# Patient Record
Sex: Female | Born: 1935 | Hispanic: No | Smoking: Never smoker
Health system: Southern US, Community
[De-identification: ages and names within clinical notes are randomized; demographics above are authoritative.]

## PROBLEM LIST (undated history)

## (undated) DIAGNOSIS — F32A Depression, unspecified: Secondary | ICD-10-CM

## (undated) DIAGNOSIS — E559 Vitamin D deficiency, unspecified: Secondary | ICD-10-CM

## (undated) DIAGNOSIS — F329 Major depressive disorder, single episode, unspecified: Secondary | ICD-10-CM

## (undated) DIAGNOSIS — I4891 Unspecified atrial fibrillation: Secondary | ICD-10-CM

## (undated) DIAGNOSIS — I499 Cardiac arrhythmia, unspecified: Secondary | ICD-10-CM

## (undated) DIAGNOSIS — N189 Chronic kidney disease, unspecified: Secondary | ICD-10-CM

## (undated) DIAGNOSIS — G243 Spasmodic torticollis: Secondary | ICD-10-CM

## (undated) DIAGNOSIS — K449 Diaphragmatic hernia without obstruction or gangrene: Secondary | ICD-10-CM

## (undated) DIAGNOSIS — E119 Type 2 diabetes mellitus without complications: Secondary | ICD-10-CM

## (undated) DIAGNOSIS — E7800 Pure hypercholesterolemia, unspecified: Secondary | ICD-10-CM

## (undated) DIAGNOSIS — I739 Peripheral vascular disease, unspecified: Secondary | ICD-10-CM

## (undated) DIAGNOSIS — K5792 Diverticulitis of intestine, part unspecified, without perforation or abscess without bleeding: Secondary | ICD-10-CM

## (undated) DIAGNOSIS — I10 Essential (primary) hypertension: Secondary | ICD-10-CM

## (undated) HISTORY — DX: Diaphragmatic hernia without obstruction or gangrene: K44.9

## (undated) HISTORY — DX: Spasmodic torticollis: G24.3

## (undated) HISTORY — DX: Vitamin D deficiency, unspecified: E55.9

## (undated) HISTORY — DX: Diverticulitis of intestine, part unspecified, without perforation or abscess without bleeding: K57.92

## (undated) HISTORY — DX: Depression, unspecified: F32.A

## (undated) HISTORY — PX: ABDOMINAL HYSTERECTOMY: SHX81

## (undated) HISTORY — DX: Essential (primary) hypertension: I10

## (undated) HISTORY — DX: Type 2 diabetes mellitus without complications: E11.9

## (undated) HISTORY — DX: Unspecified atrial fibrillation: I48.91

## (undated) HISTORY — DX: Cardiac arrhythmia, unspecified: I49.9

## (undated) HISTORY — DX: Peripheral vascular disease, unspecified: I73.9

## (undated) HISTORY — PX: CHOLECYSTECTOMY: SHX55

## (undated) HISTORY — PX: OTHER SURGICAL HISTORY: SHX169

## (undated) HISTORY — DX: Pure hypercholesterolemia, unspecified: E78.00

## (undated) HISTORY — DX: Chronic kidney disease, unspecified: N18.9

---
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
## 2018-11-15 ENCOUNTER — Other Ambulatory Visit: Payer: Self-pay | Admitting: Internal Medicine

## 2018-11-15 DIAGNOSIS — I4891 Unspecified atrial fibrillation: Secondary | ICD-10-CM

## 2018-11-23 ENCOUNTER — Other Ambulatory Visit: Payer: Self-pay

## 2018-11-23 ENCOUNTER — Encounter
Admission: RE | Admit: 2018-11-23 | Discharge: 2018-11-23 | Disposition: A | Discharge status: Home / Self Care | Payer: Medicare Other | Source: Ambulatory Visit | Attending: Internal Medicine | Admitting: Internal Medicine

## 2018-11-23 DIAGNOSIS — I4891 Unspecified atrial fibrillation: Secondary | ICD-10-CM | POA: Insufficient documentation

## 2018-11-23 LAB — NM MYOCAR MULTI W/SPECT W/WALL MOTION / EF
Estimated workload: 1 METS
Exercise duration (min): 1 min
Exercise duration (sec): 0 s
LV dias vol: 72 mL (ref 46–106)
LV sys vol: 24 mL
MPHR: 133 {beats}/min
Peak HR: 85 {beats}/min
Percent HR: 63 %
Rest HR: 55 {beats}/min
SDS: 0
SRS: 2
SSS: 0
TID: 1.02

## 2018-11-23 MED ORDER — REGADENOSON 0.4 MG/5ML IV SOLN
0.4000 mg | Freq: Once | INTRAVENOUS | Status: AC
  Administered 2018-11-23: 0.4 mg via INTRAVENOUS

## 2018-11-23 MED ORDER — TECHNETIUM TC 99M TETROFOSMIN IV KIT
30.0000 | PACK | Freq: Once | INTRAVENOUS | Status: AC | PRN
  Administered 2018-11-23: 32.073 via INTRAVENOUS

## 2018-11-23 MED ORDER — TECHNETIUM TC 99M TETROFOSMIN IV KIT
10.0000 | PACK | Freq: Once | INTRAVENOUS | Status: AC | PRN
  Administered 2018-11-23: 10.37 via INTRAVENOUS

## 2018-12-11 ENCOUNTER — Other Ambulatory Visit: Payer: Self-pay | Admitting: Family Medicine

## 2018-12-11 DIAGNOSIS — Z1231 Encounter for screening mammogram for malignant neoplasm of breast: Secondary | ICD-10-CM

## 2018-12-31 ENCOUNTER — Other Ambulatory Visit: Payer: Self-pay

## 2018-12-31 ENCOUNTER — Ambulatory Visit
Admission: RE | Admit: 2018-12-31 | Discharge: 2018-12-31 | Disposition: A | Discharge status: Home / Self Care | Payer: Medicare Other | Source: Ambulatory Visit | Attending: Family Medicine | Admitting: Family Medicine

## 2018-12-31 DIAGNOSIS — Z1231 Encounter for screening mammogram for malignant neoplasm of breast: Secondary | ICD-10-CM | POA: Diagnosis not present

## 2019-04-15 ENCOUNTER — Encounter: Payer: Self-pay | Admitting: *Deleted

## 2019-04-16 ENCOUNTER — Ambulatory Visit: Payer: Medicare Other | Admitting: Neurology

## 2019-04-16 ENCOUNTER — Other Ambulatory Visit: Payer: Self-pay

## 2019-04-16 ENCOUNTER — Encounter: Payer: Self-pay | Admitting: Neurology

## 2019-04-16 ENCOUNTER — Ambulatory Visit (INDEPENDENT_AMBULATORY_CARE_PROVIDER_SITE_OTHER): Payer: Medicare Other | Admitting: Neurology

## 2019-04-16 VITALS — BP 133/72 | HR 68 | Temp 97.2°F | Ht <= 58 in | Wt 177.5 lb

## 2019-04-16 DIAGNOSIS — G243 Spasmodic torticollis: Secondary | ICD-10-CM | POA: Diagnosis not present

## 2019-04-16 NOTE — Progress Notes (Signed)
PATIENT: Cindy Barnes DOB: June 17, 1929  Chief Complaint  Patient presents with  . Cervical Dystonia    She was previously getting Botox injections at [facility]. Her last injection was 02/04/19. She would like to establish care closer to home.  Marland Kitchen PCP    Kandyce Rud, MD     HISTORICAL  Cindy Barnes is a 86 year old female, seen in request by her primary care physician Dr. Larwance Sachs, Berna Spare for evaluation of continued botulism toxin injection for her cervical dystonia, initial evaluation was on April 16, 2019.  I have reviewed and summarized the referring note from the referring physician.  She had past medical history of hypertension, hyperlipidemia, diabetes, atrial fibrillation, on chronic anticoagulation treatment.  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,  [place] [facility] [place] [facility], she began to notice cervical dystonia symptoms in her 74s, at that time, she was under a lot of stress, develop had drawing to right shoulder, significant neck pain, eventually to the point is difficult for her to straighten out her neck, she had partial right accessory nerve resection by neurosurgeon at the 1980s, which has made a big difference," likes which was turned off", since surgery, she denies significant low back pain  Over the years, her symptoms gradually returned, but to a much lesser degree, she described tendency of holding her head towards her right shoulder, frequent head titubation, also developed gradual onset bilateral hands tremor, she used to be right-handed, to the point she has difficulty writing with her right hand, she learned to write with her left hand, now even her left hand handwriting is ineligible, she also described gradual onset of voice change,  She began to receive EMG guided botulism toxin injection at [place] since 1990s, which has helped her symptoms, since movement to [place], she received a few injection by Dr.  Clovis Riley, last one was on February 04, 2019, she received 300 units, tolerating it well, denies significant side effect.  She denies gait abnormality, no significant pain,  REVIEW OF SYSTEMS: Full 14 system review of systems performed and notable only for as above All other review of systems were negative.  ALLERGIES: Allergies  Allergen Reactions  . Shellfish Allergy Nausea And Vomiting    Severe nausea/vomiting and diarrhea Severe nausea/vomiting and diarrhea   . Doxycycline Diarrhea  . Metronidazole Other (See Comments)  . Olmesartan Medoxomil-Hctz     Other reaction(s): Unknown  . Penicillin G Rash  . Sulfa Antibiotics Rash  . Sulfamethoxazole-Trimethoprim Rash    HOME MEDICATIONS: Current Outpatient Medications  Medication Sig Dispense Refill  . amiodarone (PACERONE) 200 MG tablet Take 200 mg by mouth as needed. M, W, F    . amLODipine (NORVASC) 10 MG tablet Take 1 tablet by mouth daily.    Marland Kitchen apixaban (ELIQUIS) 5 MG TABS tablet Take 1 tablet by mouth daily.    Marland Kitchen aspirin 81 MG chewable tablet Chew 1 tablet by mouth daily.    . B Complex Vitamins (B COMPLEX 1 PO) B Complex  1 QD    . BIOTIN PO Take 10,000 mcg by mouth daily.    . Cholecalciferol (VITAMIN D3 PO) Take 1,000 Units by mouth daily.    Marland Kitchen ezetimibe (ZETIA) 10 MG tablet Take 1 tablet by mouth daily.    Marland Kitchen losartan (COZAAR) 25 MG tablet Take 1 tablet by mouth daily.    . Magnesium Oxide 400 MG CAPS Take 1 capsule by mouth daily.    Marland Kitchen  metFORMIN (GLUCOPHAGE) 500 MG tablet Take 500 mg by mouth daily.    . Omega-3 Fatty Acids (FISH OIL) 1000 MG CAPS Take 1 capsule by mouth daily.    . propranolol (INNOPRAN XL) 80 MG 24 hr capsule Take 1 capsule by mouth daily.    . rosuvastatin (CRESTOR) 5 MG tablet Take 1 tablet by mouth daily.     No current facility-administered medications for this visit.    PAST MEDICAL HISTORY: Past Medical History:  Diagnosis Date  . Arrhythmia   . Atrial fibrillation (HCC)   . Cervical  dystonia   . Chronic kidney disease    stage 3  . Depression   . Diabetes (HCC)   . Diverticulitis   . Hiatal hernia   . Hypercholesteremia   . Hypertension   . Peripheral vascular disease (HCC)   . Vitamin D deficiency     PAST SURGICAL HISTORY: Past Surgical History:  Procedure Laterality Date  . 11th cranial nerve transection    . ABDOMINAL HYSTERECTOMY    . CHOLECYSTECTOMY      FAMILY HISTORY: Family History  Problem Relation Age of Onset  . Heart attack Mother   . COPD Father     SOCIAL HISTORY: Social History   Socioeconomic History  . Marital status: Widowed    Spouse name: Not on file  . Number of children: 2  . Years of education: college  . Highest education level: Bachelor's degree (e.g., BA, AB, BS)  Occupational History  . Occupation: Retired  Tobacco Use  . Smoking status: Never Smoker  . Smokeless tobacco: Never Used  Substance and Sexual Activity  . Alcohol use: Yes    Comment: one glass of wine per week  . Drug use: Never  . Sexual activity: Not on file  Other Topics Concern  . Not on file  Social History Narrative   Lives alone.   One Coke per day.   Ambidextrous.   Social Determinants of Health   Financial Resource Strain:   . Difficulty of Paying Living Expenses:   Food Insecurity:   . Worried About Programme researcher, broadcasting/film/video in the Last Year:   . Barista in the Last Year:   Transportation Needs:   . Freight forwarder (Medical):   Marland Kitchen Lack of Transportation (Non-Medical):   Physical Activity:   . Days of Exercise per Week:   . Minutes of Exercise per Session:   Stress:   . Feeling of Stress :   Social Connections:   . Frequency of Communication with Friends and Family:   . Frequency of Social Gatherings with Friends and Family:   . Attends Religious Services:   . Active Member of Clubs or Organizations:   . Attends Banker Meetings:   Marland Kitchen Marital Status:   Intimate Partner Violence:   . Fear of Current or  Ex-Partner:   . Emotionally Abused:   Marland Kitchen Physically Abused:   . Sexually Abused:      PHYSICAL EXAM   Vitals:   04/16/19 0748  BP: 133/72  Pulse: 68  Temp: (!) 97.2 F (36.2 C)  Weight: 177 lb 8 oz (80.5 kg)  Height: 4\' 7"  (1.397 m)    Not recorded      Body mass index is 41.25 kg/m.  PHYSICAL EXAMNIATION:  Gen: NAD, conversant, well nourised, well groomed                     Cardiovascular: Regular  rate rhythm, no peripheral edema, warm, nontender. Eyes: Conjunctivae clear without exudates or hemorrhage Neck: Supple, no carotid bruits. Pulmonary: Clear to auscultation bilaterally   NEUROLOGICAL EXAM:  MENTAL STATUS: She has moderate right turn, mild anterocollis, left tilt, left shoulder elevation, constant head titubation Speech:    Speech is normal; fluent and spontaneous with normal comprehension.  Cognition:     Orientation to time, place and person     Normal recent and remote memory     Normal Attention span and concentration     Normal Language, naming, repeating,spontaneous speech     Fund of knowledge   CRANIAL NERVES: CN II: Visual fields are full to confrontation. Pupils are round equal and briskly reactive to light. CN III, IV, VI: extraocular movement are normal. No ptosis. CN V: Facial sensation is intact to light touch CN VII: Face is symmetric with normal eye closure  CN VIII: Hearing is normal to causal conversation. CN IX, X: Phonation is normal. CN XI: Head turning and shoulder shrug are intact  MOTOR: There is no pronator drift of out-stretched arms. Muscle bulk and tone are normal. Muscle strength is normal.  REFLEXES: Reflexes are 2+ and symmetric at the biceps, triceps, knees, and ankles. Plantar responses are flexor.  SENSORY: Intact to light touch, pinprick and vibratory sensation are intact in fingers and toes.  COORDINATION: There is no trunk or limb dysmetria noted.  GAIT/STANCE: Posture is normal. Gait is steady with  normal steps, base, arm swing, and turning.  Romberg is absent.   DIAGNOSTIC DATA (LABS, IMAGING, TESTING) - I reviewed patient records, labs, notes, testing and imaging myself where available.   ASSESSMENT AND PLAN  Cindy Barnes is a 86 y.o. 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 Ph.D.  [facility] [address] [place],  [address] Ph: [phone] Fax: [phone]  CC: Derinda Late, MD

## 2019-04-29 ENCOUNTER — Telehealth: Payer: Self-pay | Admitting: *Deleted

## 2019-04-29 NOTE — Telephone Encounter (Signed)
I called [employer] [phone] and spoke to Molly Maduro who verified that patient is eligible for coverage.  States he cant check codes but referred me to call [phone]. I called and spoke to [facility].  She states that since Medicare is primary [employer] will not require PA.  No ref# for this call was provided.

## 2019-05-08 ENCOUNTER — Encounter: Payer: Self-pay | Admitting: Neurology

## 2019-05-08 ENCOUNTER — Ambulatory Visit (INDEPENDENT_AMBULATORY_CARE_PROVIDER_SITE_OTHER): Payer: Medicare Other | Admitting: Neurology

## 2019-05-08 ENCOUNTER — Other Ambulatory Visit: Payer: Self-pay

## 2019-05-08 VITALS — BP 127/72 | HR 58 | Temp 97.1°F | Ht <= 58 in | Wt 179.5 lb

## 2019-05-08 DIAGNOSIS — G243 Spasmodic torticollis: Secondary | ICD-10-CM

## 2019-05-08 MED ORDER — ONABOTULINUMTOXINA 100 UNITS IJ SOLR
200.0000 [IU] | Freq: Once | INTRAMUSCULAR | Status: AC
  Administered 2019-05-08: 16:00:00 200 [IU] via INTRAMUSCULAR

## 2019-05-08 NOTE — Progress Notes (Signed)
**  Botox 200 units x 1 vial, NDC 9021-1155-20, Lot [id number], Exp 12/2021, office supply.//mck,rn**

## 2019-05-08 NOTE — Progress Notes (Signed)
PATIENT: Cindy Barnes DOB: November 29, 1929  Chief Complaint  Patient presents with  . Cervical Dystonia    Botox 200 units x 1 vial - office supply     HISTORICAL  Cindy Barnes is a 86 year old female, seen in request by her primary care physician Dr. Baldemar Lenis, Beverely Low for evaluation of continued botulism toxin injection for her cervical dystonia, initial evaluation was on April 16, 2019.  I have reviewed and summarized the referring note from the referring physician.  She had past medical history of hypertension, hyperlipidemia, diabetes, atrial fibrillation, on chronic anticoagulation treatment.  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,  She [facility] from [place] [facility] in [facility], she began to notice cervical dystonia symptoms in her 52s, at that time, she was under a lot of stress, develop had drawing to right shoulder, significant neck pain, eventually to the point is difficult for her to straighten out her neck, she had partial right accessory nerve resection by neurosurgeon in 1980s, which has made a big difference," likes which was turned off", since surgery, she denies significant low back pain  Over the years, her symptoms gradually returned, but to a much lesser degree, she described tendency of holding her head towards her right shoulder, frequent head titubation, also developed gradual onset bilateral hands tremor, she used to be right-handed, to the point she has difficulty writing with her right hand, she learned to write with her left hand, now even her left hand handwriting is ineligible, she also described gradual onset of voice change,  She began to receive EMG guided botulism toxin injection at [place] since 1990s, which has helped her symptoms, since movement to [place], she received a few injection by Dr. Alroy Dust, last one was on February 04, 2019, she received 300 units, tolerating it well, denies significant side effect.  She denies gait  abnormality, no significant pain,  UPDATE May 08 2019: This is her first EMG guided Botox a injection for her cervical dystonia through our office, she denies significant neck pain," when getting so used to it", she does have constant head titubation, we decided to decrease Botox a from 300 units to 200 units today,  REVIEW OF SYSTEMS: Full 14 system review of systems performed and notable only for as above All other review of systems were negative.  ALLERGIES: Allergies  Allergen Reactions  . Shellfish Allergy Nausea And Vomiting    Severe nausea/vomiting and diarrhea Severe nausea/vomiting and diarrhea   . Doxycycline Diarrhea  . Metronidazole Other (See Comments)  . Olmesartan Medoxomil-Hctz     Other reaction(s): Unknown  . Penicillin G Rash  . Sulfa Antibiotics Rash  . Sulfamethoxazole-Trimethoprim Rash    HOME MEDICATIONS: Current Outpatient Medications  Medication Sig Dispense Refill  . amiodarone (PACERONE) 200 MG tablet Take 200 mg by mouth as needed. M, W, F    . amLODipine (NORVASC) 10 MG tablet Take 1 tablet by mouth daily.    Marland Kitchen apixaban (ELIQUIS) 5 MG TABS tablet Take 1 tablet by mouth daily.    Marland Kitchen aspirin 81 MG chewable tablet Chew 1 tablet by mouth daily.    . B Complex Vitamins (B COMPLEX 1 PO) B Complex  1 QD    . BIOTIN PO Take 10,000 mcg by mouth daily.    . Botulinum Toxin Type A (BOTOX) 200 units SOLR Inject 200 Units as directed every 3 (three) months.    . Cholecalciferol (VITAMIN D3 PO) Take 1,000 Units by mouth daily.    Marland Kitchen  ezetimibe (ZETIA) 10 MG tablet Take 1 tablet by mouth daily.    Marland Kitchen losartan (COZAAR) 25 MG tablet Take 1 tablet by mouth daily.    . Magnesium Oxide 400 MG CAPS Take 1 capsule by mouth daily.    . metFORMIN (GLUCOPHAGE) 500 MG tablet Take 500 mg by mouth daily.    . Omega-3 Fatty Acids (FISH OIL) 1000 MG CAPS Take 1 capsule by mouth daily.    . propranolol (INNOPRAN XL) 80 MG 24 hr capsule Take 1 capsule by mouth daily.    .  rosuvastatin (CRESTOR) 5 MG tablet Take 1 tablet by mouth daily.     No current facility-administered medications for this visit.    PAST MEDICAL HISTORY: Past Medical History:  Diagnosis Date  . Arrhythmia   . Atrial fibrillation (HCC)   . Cervical dystonia   . Chronic kidney disease    stage 3  . Depression   . Diabetes (HCC)   . Diverticulitis   . Hiatal hernia   . Hypercholesteremia   . Hypertension   . Peripheral vascular disease (HCC)   . Vitamin D deficiency     PAST SURGICAL HISTORY: Past Surgical History:  Procedure Laterality Date  . 11th cranial nerve transection    . ABDOMINAL HYSTERECTOMY    . CHOLECYSTECTOMY      FAMILY HISTORY: Family History  Problem Relation Age of Onset  . Heart attack Mother   . COPD Father     SOCIAL HISTORY: Social History   Socioeconomic History  . Marital status: Widowed    Spouse name: Not on file  . Number of children: 2  . Years of education: college  . Highest education level: Bachelor's degree (e.g., BA, AB, BS)  Occupational History  . Occupation: Retired  Tobacco Use  . Smoking status: Never Smoker  . Smokeless tobacco: Never Used  Substance and Sexual Activity  . Alcohol use: Yes    Comment: one glass of wine per week  . Drug use: Never  . Sexual activity: Not on file  Other Topics Concern  . Not on file  Social History Narrative   Lives alone.   One Coke per day.   Ambidextrous.   Social Determinants of Health   Financial Resource Strain:   . Difficulty of Paying Living Expenses:   Food Insecurity:   . Worried About Programme researcher, broadcasting/film/video in the Last Year:   . Barista in the Last Year:   Transportation Needs:   . Freight forwarder (Medical):   Marland Kitchen Lack of Transportation (Non-Medical):   Physical Activity:   . Days of Exercise per Week:   . Minutes of Exercise per Session:   Stress:   . Feeling of Stress :   Social Connections:   . Frequency of Communication with Friends and Family:    . Frequency of Social Gatherings with Friends and Family:   . Attends Religious Services:   . Active Member of Clubs or Organizations:   . Attends Banker Meetings:   Marland Kitchen Marital Status:   Intimate Partner Violence:   . Fear of Current or Ex-Partner:   . Emotionally Abused:   Marland Kitchen Physically Abused:   . Sexually Abused:      PHYSICAL EXAM   Vitals:   05/08/19 1312  BP: 127/72  Pulse: (!) 58  Temp: (!) 97.1 F (36.2 C)  Weight: 179 lb 8 oz (81.4 kg)  Height: 4\' 7"  (1.397 m)  Not recorded      Body mass index is 41.72 kg/m.  PHYSICAL EXAMNIATION:  She has moderate right turn, mild anterocollis, left tilt, left shoulder elevation, constant head titubation   DIAGNOSTIC DATA (LABS, IMAGING, TESTING) - I reviewed patient records, labs, notes, testing and imaging myself where available.   ASSESSMENT AND PLAN  Cindy Barnes is a 86 y.o. 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  Last EMG guided botulism toxin injection was on February 04, 2019, used to Botox a 300 units,  Under EMG guidance, we used 200 units of Botox A today  Left sternocleidomastoid 25 units x 2= 50 units Left levator scapular 25 units   Left splenius capitis 25 units Left splenius cervix 25 units  Right longissimus capitis 25 units Right inferior oblique capitis 25 units Right splenius capitis 25 units   She complains of significant needle pain with each needle penetration, despite we changed to a different needle, she will return to clinic in 3 months for repeat injection   Levert Feinstein, M.D. Ph.D.  [facility] [address] [place], [place] [address] Ph: [phone] Fax: [phone]  CC: Kandyce Rud, MD

## 2019-05-14 ENCOUNTER — Ambulatory Visit: Payer: Medicare Other | Admitting: Neurology

## 2019-08-07 ENCOUNTER — Ambulatory Visit: Payer: Medicare Other | Admitting: Neurology

## 2019-11-25 ENCOUNTER — Other Ambulatory Visit: Payer: Self-pay | Admitting: Family Medicine

## 2019-11-25 DIAGNOSIS — Z1231 Encounter for screening mammogram for malignant neoplasm of breast: Secondary | ICD-10-CM

## 2020-01-01 ENCOUNTER — Other Ambulatory Visit: Payer: Self-pay

## 2020-01-01 ENCOUNTER — Ambulatory Visit
Admission: RE | Admit: 2020-01-01 | Discharge: 2020-01-01 | Disposition: A | Discharge status: Home / Self Care | Payer: Medicare Other | Source: Ambulatory Visit | Attending: Family Medicine | Admitting: Family Medicine

## 2020-01-01 DIAGNOSIS — Z1231 Encounter for screening mammogram for malignant neoplasm of breast: Secondary | ICD-10-CM | POA: Diagnosis present

## 2020-01-20 ENCOUNTER — Telehealth: Payer: Self-pay | Admitting: Neurology

## 2020-01-20 NOTE — Telephone Encounter (Signed)
Ok to put her on schedule for injection, but please do not dissolve the toxin unit I see her first

## 2020-01-20 NOTE — Telephone Encounter (Signed)
Patient called over the weekend and LVM regarding Botox. Patient has not returned for injections after April 2021 visit. Is it okay to make patient an appointment for injection, or should she come in and be seen first?

## 2020-01-20 NOTE — Telephone Encounter (Signed)
Dr Terrace Arabia please advise, thanks.

## 2020-01-23 NOTE — Telephone Encounter (Signed)
I called the patient and scheduled her for 2/10 at 1:00.

## 2020-02-20 ENCOUNTER — Encounter: Payer: Self-pay | Admitting: Neurology

## 2020-02-20 ENCOUNTER — Ambulatory Visit (INDEPENDENT_AMBULATORY_CARE_PROVIDER_SITE_OTHER): Payer: Medicare Other | Admitting: Neurology

## 2020-02-20 VITALS — BP 126/58 | HR 60 | Ht <= 58 in | Wt 189.0 lb

## 2020-02-20 DIAGNOSIS — G243 Spasmodic torticollis: Secondary | ICD-10-CM

## 2020-02-20 NOTE — Progress Notes (Signed)
PATIENT: Cindy Barnes DOB: 02/07/1929  Chief Complaint  Patient presents with  . Procedure    Cervical Dystonia - Botox     HISTORICAL  Cindy Barnes is a 87 year old female, seen in request by her primary care physician Dr. Larwance Sachs, [facility]a Spare for evaluation of continued botulism toxin injection for her cervical dystonia, initial evaluation was on April 16, 2019.  I have reviewed and summarized the referring note from the referring physician.  She had past medical history of hypertension, hyperlipidemia, diabetes, atrial fibrillation, on chronic anticoagulation treatment.  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,  She moved from [place] to [facility] [place] [facility] in July 2020, she began to notice cervical dystonia symptoms in her 35s, at that time, she was under a lot of stress, develop had drawing to right shoulder, significant neck pain, eventually to the point is difficult for her to straighten out her neck, she had partial right accessory nerve resection by neurosurgeon in 1980s, which has made a big difference," likes which was turned off", since surgery, she denies significant low back pain  Over the years, her symptoms gradually returned, but to a much lesser degree, she described tendency of holding her head towards her right shoulder, frequent head titubation, also developed gradual onset bilateral hands tremor, she used to be right-handed, to the point she has difficulty writing with her right hand, she learned to write with her left hand, now even her left hand handwriting is ineligible, she also described gradual onset of voice change,  She began to receive EMG guided botulism toxin injection at [place] since 1990s, which has helped her symptoms, since movement to [place], she received a few injection by Dr. Clovis Riley, last one was on February 04, 2019, she received 300 units, tolerating it well, denies significant side effect.  She denies gait abnormality, no  significant pain,  UPDATE May 08 2019: This is her first EMG guided Botox a injection for her cervical dystonia through our office, she denies significant neck pain," when getting so used to it", she does have constant head titubation, we decided to decrease Botox a from 300 units to 200 units today,  UPDATE Feb 20 2020: Return for cervical dystonia injection, responding well to previous injection, but complains of significant medial pain, was injected by [facility] over the past few months, for convenience reasons, wants to transfer to our clinic,  REVIEW OF SYSTEMS: Full 14 system review of systems performed and notable only for as above All other review of systems were negative.  ALLERGIES: Allergies  Allergen Reactions  . Shellfish Allergy Nausea And Vomiting    Severe nausea/vomiting and diarrhea Severe nausea/vomiting and diarrhea   . Doxycycline Diarrhea  . Metronidazole Other (See Comments)  . Olmesartan Medoxomil-Hctz     Other reaction(s): Unknown  . Penicillin G Rash  . Sulfa Antibiotics Rash  . Sulfamethoxazole-Trimethoprim Rash    HOME MEDICATIONS: Current Outpatient Medications  Medication Sig Dispense Refill  . amiodarone (PACERONE) 200 MG tablet Take 200 mg by mouth as needed. M, W, F    . amLODipine (NORVASC) 10 MG tablet Take 1 tablet by mouth daily.    Marland Kitchen apixaban (ELIQUIS) 5 MG TABS tablet Take 1 tablet by mouth daily.    Marland Kitchen aspirin 81 MG chewable tablet Chew 1 tablet by mouth daily.    . B Complex Vitamins (B COMPLEX 1 PO) B Complex  1 QD    . BIOTIN PO Take 10,000 mcg by mouth daily.    Marland Kitchen  Botulinum Toxin Type A (BOTOX) 200 units SOLR Inject 200 Units as directed every 3 (three) months.    . Cholecalciferol (VITAMIN D3 PO) Take 1,000 Units by mouth daily.    Marland Kitchen ezetimibe (ZETIA) 10 MG tablet Take 1 tablet by mouth daily.    Marland Kitchen losartan (COZAAR) 25 MG tablet Take 1 tablet by mouth daily.    . Magnesium Oxide 400 MG CAPS Take 1 capsule by mouth daily.    .  metFORMIN (GLUCOPHAGE) 500 MG tablet Take 500 mg by mouth daily.    . Omega-3 Fatty Acids (FISH OIL) 1000 MG CAPS Take 1 capsule by mouth daily.    . propranolol (INNOPRAN XL) 80 MG 24 hr capsule Take 1 capsule by mouth daily.    . rosuvastatin (CRESTOR) 5 MG tablet Take 1 tablet by mouth daily.     No current facility-administered medications for this visit.    PAST MEDICAL HISTORY: Past Medical History:  Diagnosis Date  . Arrhythmia   . Atrial fibrillation (HCC)   . Cervical dystonia   . Chronic kidney disease    stage 3  . Depression   . Diabetes (HCC)   . Diverticulitis   . Hiatal hernia   . Hypercholesteremia   . Hypertension   . Peripheral vascular disease (HCC)   . Vitamin D deficiency     PAST SURGICAL HISTORY: Past Surgical History:  Procedure Laterality Date  . 11th cranial nerve transection    . ABDOMINAL HYSTERECTOMY    . CHOLECYSTECTOMY      FAMILY HISTORY: Family History  Problem Relation Age of Onset  . Heart attack Mother   . COPD Father     SOCIAL HISTORY: Social History   Socioeconomic History  . Marital status: Widowed    Spouse name: Not on file  . Number of children: 2  . Years of education: college  . Highest education level: Bachelor's degree (e.g., BA, AB, BS)  Occupational History  . Occupation: Retired  Tobacco Use  . Smoking status: Never Smoker  . Smokeless tobacco: Never Used  Substance and Sexual Activity  . Alcohol use: Yes    Comment: one glass of wine per week  . Drug use: Never  . Sexual activity: Not on file  Other Topics Concern  . Not on file  Social History Narrative   Lives alone.   One Coke per day.   Ambidextrous.   Social Determinants of Health   Financial Resource Strain: Not on file  Food Insecurity: Not on file  Transportation Needs: Not on file  Physical Activity: Not on file  Stress: Not on file  Social Connections: Not on file  Intimate Partner Violence: Not on file     PHYSICAL EXAM    Vitals:   02/20/20 1230  BP: (!) 126/58  Pulse: 60  Weight: 189 lb (85.7 kg)  Height: 4\' 7"  (1.397 m)   Not recorded     Body mass index is 43.93 kg/m.  PHYSICAL EXAMNIATION:  She has moderate right turn, mild anterocollis, left tilt, left shoulder elevation, constant head titubation   DIAGNOSTIC DATA (LABS, IMAGING, TESTING) - I reviewed patient records, labs, notes, testing and imaging myself where available.   ASSESSMENT AND PLAN  Cindy Barnes is a 87 y.o. 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D. Ph.D.  [facility] [address] [place], [place] [address] Ph: [phone] Fax: [phone]  CC: Kandyce Rud, MD

## 2020-02-20 NOTE — Progress Notes (Signed)
**  Botox 100 units x 2 vials, NDC 0023-1145-01, Lot [id number], Exp 02/2022, office supply.//mck,rn** 

## 2020-02-28 DIAGNOSIS — G243 Spasmodic torticollis: Secondary | ICD-10-CM | POA: Diagnosis not present

## 2020-02-28 MED ORDER — ONABOTULINUMTOXINA 100 UNITS IJ SOLR
200.0000 [IU] | Freq: Once | INTRAMUSCULAR | Status: AC
  Administered 2020-02-28: 200 [IU] via INTRAMUSCULAR

## 2020-05-20 ENCOUNTER — Ambulatory Visit: Payer: Medicare Other | Admitting: Neurology

## 2020-08-03 ENCOUNTER — Telehealth: Payer: Self-pay | Admitting: Neurology

## 2020-08-03 NOTE — Telephone Encounter (Signed)
Patient called in requesting appointment for Botox. Her last appointment with [place] was 02/20/20. I offered an opening we have this Wednesday, 7/27. She states she alternates between Dr. Terrace Arabia and a Neurologist at [facility]. Her last injection at [facility] was 5/12. I advised that there is not currently an opening with Dr. Terrace Arabia in mid-August, when she will be due.

## 2020-08-12 ENCOUNTER — Telehealth: Payer: Self-pay | Admitting: Neurology

## 2020-08-12 NOTE — Telephone Encounter (Signed)
Good afternoon Deari  Dr. Terrace Arabia is booked up on the 18th and we aren't open on Fridays.    I can schedule you for Botox on 08/23, Tuesday @ 1130, arrival time of 1115.  Please let me know if that works and I will confirm the appointment.  Thank you Cicero Duck, RN

## 2020-08-12 NOTE — Telephone Encounter (Signed)
Pt would like a all to discuss scheduling Botox for somewhere around 8-18 or 8-19 please call (since Botox Coordinator is out of office)

## 2020-08-12 NOTE — Telephone Encounter (Signed)
Patient confirmed for 08/23 @ 1130 with arrival time of 1115.  Patient denied further questions, verbalized understanding and expressed appreciation for the phone call.

## 2020-09-01 ENCOUNTER — Ambulatory Visit (INDEPENDENT_AMBULATORY_CARE_PROVIDER_SITE_OTHER): Payer: Medicare Other | Admitting: Neurology

## 2020-09-01 ENCOUNTER — Encounter: Payer: Self-pay | Admitting: Neurology

## 2020-09-01 VITALS — BP 115/68 | HR 92 | Ht <= 58 in | Wt 185.0 lb

## 2020-09-01 DIAGNOSIS — G243 Spasmodic torticollis: Secondary | ICD-10-CM | POA: Diagnosis not present

## 2020-09-01 MED ORDER — ONABOTULINUMTOXINA 100 UNITS IJ SOLR
200.0000 [IU] | Freq: Once | INTRAMUSCULAR | Status: AC
  Administered 2020-09-01: 200 [IU] via INTRAMUSCULAR

## 2020-09-01 NOTE — Progress Notes (Signed)
**  Botox 100 units x 2 vials, NDC 7841-2820-81, Lot [id number], Exp 09/2021, office supply.//mck

## 2020-09-01 NOTE — Progress Notes (Signed)
PATIENT: Cindy Barnes DOB: April 12, 1929  Chief Complaint  Patient presents with   Procedure    Botox     HISTORICAL  Cindy Barnes is a 87 year old female, seen in request by her primary care physician Dr. Larwance Sachs, [facility] Spare for evaluation of continued botulism toxin injection for her cervical dystonia, initial evaluation was on April 16, 2019.  I have reviewed and summarized the referring note from the referring physician.  She had past medical history of hypertension, hyperlipidemia, diabetes, atrial fibrillation, on chronic anticoagulation treatment.  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,  She moved from [place] to [facility] in July 2020, she began to notice cervical dystonia symptoms in her 68s, at that time, she was under a lot of stress, develop had drawing to right shoulder, significant neck pain, eventually to the point is difficult for her to straighten out her neck, she had partial right accessory nerve resection by neurosurgeon in 1980s, which has made a big difference," likes which was turned off", since surgery, she denies significant low back pain  Over the years, her symptoms gradually returned, but to a much lesser degree, she described tendency of holding her head towards her right shoulder, frequent head titubation, also developed gradual onset bilateral hands tremor, she used to be right-handed, to the point she has difficulty writing with her right hand, she learned to write with her left hand, now even her left hand handwriting is ineligible, she also described gradual onset of voice change,  She began to receive EMG guided botulism toxin injection at [place] since 1990s, which has helped her symptoms, since movement to [place], she received a few injection by Dr. Clovis Riley, last one was on February 04, 2019, she received 300 units, tolerating it well, denies significant side effect.  She denies gait abnormality, no significant pain,  UPDATE  May 08 2019: This is her first EMG guided Botox a injection for her cervical dystonia through our office, she denies significant neck pain," when getting so used to it", she does have constant head titubation, we decided to decrease Botox a from 300 units to 200 units today,  UPDATE Feb 20 2020: Return for cervical dystonia injection, responding well to previous injection, but complains of significant medial pain, was injected by [facility] over the past few months, for convenience reasons, wants to transfer to our clinic,  UPDATE September 01 2020: She has been receiving injection at [facility] neurologist in between, last injection at [facility] was by Dr. Clovis Riley on May 21, 2020, received 300 units,  She returns today for the convenience, lives in [place], but she has been complaining of unbearable needle pain, seems to be more at our office injection compared to [facility] office  We used Botox a 200 units today, she will continue rest of the injections from [facility],  REVIEW OF SYSTEMS: Full 14 system review of systems performed and notable only for as above All other review of systems were negative.  ALLERGIES: Allergies  Allergen Reactions   Shellfish Allergy Nausea And Vomiting    Severe nausea/vomiting and diarrhea Severe nausea/vomiting and diarrhea    Doxycycline Diarrhea   Metronidazole Other (See Comments)   Olmesartan Medoxomil-Hctz     Other reaction(s): Unknown   Penicillin G Rash   Sulfa Antibiotics Rash   Sulfamethoxazole-Trimethoprim Rash    HOME MEDICATIONS: Current Outpatient Medications  Medication Sig Dispense Refill   amiodarone (PACERONE) 200 MG tablet Take 200 mg by mouth as needed. M, W, F  amLODipine (NORVASC) 10 MG tablet Take 1 tablet by mouth daily.     apixaban (ELIQUIS) 5 MG TABS tablet Take 1 tablet by mouth daily.     aspirin 81 MG chewable tablet Chew 1 tablet by mouth daily.     B Complex Vitamins (B COMPLEX 1 PO) B Complex  1 QD     BIOTIN PO Take  10,000 mcg by mouth daily.     botulinum toxin Type A (BOTOX) 100 units SOLR injection Inject 100 Units into the muscle once.     Botulinum Toxin Type A (BOTOX) 200 units SOLR Inject 200 Units as directed every 3 (three) months.     Cholecalciferol (VITAMIN D3 PO) Take 1,000 Units by mouth daily.     ezetimibe (ZETIA) 10 MG tablet Take 1 tablet by mouth daily.     losartan (COZAAR) 25 MG tablet Take 1 tablet by mouth daily.     Magnesium Oxide 400 MG CAPS Take 1 capsule by mouth daily.     metFORMIN (GLUCOPHAGE) 500 MG tablet Take 500 mg by mouth daily.     Omega-3 Fatty Acids (FISH OIL) 1000 MG CAPS Take 1 capsule by mouth daily.     propranolol (INNOPRAN XL) 80 MG 24 hr capsule Take 1 capsule by mouth daily.     rosuvastatin (CRESTOR) 5 MG tablet Take 1 tablet by mouth daily.     No current facility-administered medications for this visit.    PAST MEDICAL HISTORY: Past Medical History:  Diagnosis Date   Arrhythmia    Atrial fibrillation (HCC)    Cervical dystonia    Chronic kidney disease    stage 3   Depression    Diabetes (HCC)    Diverticulitis    Hiatal hernia    Hypercholesteremia    Hypertension    Peripheral vascular disease (HCC)    Vitamin D deficiency     PAST SURGICAL HISTORY: Past Surgical History:  Procedure Laterality Date   11th cranial nerve transection     ABDOMINAL HYSTERECTOMY     CHOLECYSTECTOMY      FAMILY HISTORY: Family History  Problem Relation Age of Onset   Heart attack Mother    COPD Father     SOCIAL HISTORY: Social History   Socioeconomic History   Marital status: Widowed    Spouse name: Not on file   Number of children: 2   Years of education: college   Highest education level: Bachelor's degree (e.g., BA, AB, BS)  Occupational History   Occupation: Retired  Tobacco Use   Smoking status: Never   Smokeless tobacco: Never  Substance and Sexual Activity   Alcohol use: Yes    Comment: one glass of wine per week   Drug use:  Never   Sexual activity: Not on file  Other Topics Concern   Not on file  Social History Narrative   Lives alone.   One Coke per day.   Ambidextrous.   Social Determinants of Health   Financial Resource Strain: Not on file  Food Insecurity: Not on file  Transportation Needs: Not on file  Physical Activity: Not on file  Stress: Not on file  Social Connections: Not on file  Intimate Partner Violence: Not on file     PHYSICAL EXAM   Vitals:   09/01/20 1111  BP: 115/68  Pulse: 92  Weight: 185 lb (83.9 kg)  Height: 4\' 7"  (1.397 m)   Not recorded     Body mass index is 43  kg/m.  PHYSICAL EXAMNIATION:  She has moderate right turn, mild anterocollis, left tilt, left shoulder elevation, constant head titubation  ASSESSMENT AND PLAN  Che Below is a 87 y.o. 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 Ph.D.  [facility] [address] [place], [place] [address] Ph: [phone] Fax: [phone]  CC: Kandyce Rud, MD

## 2020-10-17 ENCOUNTER — Other Ambulatory Visit: Payer: Self-pay

## 2020-10-17 ENCOUNTER — Emergency Department: Payer: Medicare Other

## 2020-10-17 ENCOUNTER — Emergency Department
Admission: EM | Admit: 2020-10-17 | Discharge: 2020-10-17 | Disposition: A | Discharge status: Home / Self Care | Payer: Medicare Other | Attending: Emergency Medicine | Admitting: Emergency Medicine

## 2020-10-17 ENCOUNTER — Encounter: Payer: Self-pay | Admitting: Emergency Medicine

## 2020-10-17 DIAGNOSIS — Z7901 Long term (current) use of anticoagulants: Secondary | ICD-10-CM | POA: Insufficient documentation

## 2020-10-17 DIAGNOSIS — Z79899 Other long term (current) drug therapy: Secondary | ICD-10-CM | POA: Diagnosis not present

## 2020-10-17 DIAGNOSIS — R0602 Shortness of breath: Secondary | ICD-10-CM | POA: Diagnosis present

## 2020-10-17 DIAGNOSIS — E1122 Type 2 diabetes mellitus with diabetic chronic kidney disease: Secondary | ICD-10-CM | POA: Diagnosis not present

## 2020-10-17 DIAGNOSIS — J810 Acute pulmonary edema: Secondary | ICD-10-CM | POA: Diagnosis not present

## 2020-10-17 DIAGNOSIS — I4891 Unspecified atrial fibrillation: Secondary | ICD-10-CM | POA: Insufficient documentation

## 2020-10-17 DIAGNOSIS — Z7984 Long term (current) use of oral hypoglycemic drugs: Secondary | ICD-10-CM | POA: Diagnosis not present

## 2020-10-17 DIAGNOSIS — Z7982 Long term (current) use of aspirin: Secondary | ICD-10-CM | POA: Insufficient documentation

## 2020-10-17 DIAGNOSIS — N183 Chronic kidney disease, stage 3 unspecified: Secondary | ICD-10-CM | POA: Diagnosis not present

## 2020-10-17 DIAGNOSIS — I129 Hypertensive chronic kidney disease with stage 1 through stage 4 chronic kidney disease, or unspecified chronic kidney disease: Secondary | ICD-10-CM | POA: Insufficient documentation

## 2020-10-17 LAB — BASIC METABOLIC PANEL
Anion gap: 6 (ref 5–15)
BUN: 14 mg/dL (ref 8–23)
CO2: 26 mmol/L (ref 22–32)
Calcium: 9.3 mg/dL (ref 8.9–10.3)
Chloride: 104 mmol/L (ref 98–111)
Creatinine, Ser: 0.88 mg/dL (ref 0.44–1.00)
GFR, Estimated: >60 mL/min (ref >60)
Glucose, Bld: 119 mg/dL — ABNORMAL HIGH (ref 70–99)
Potassium: 3.9 mmol/L (ref 3.5–5.1)
Sodium: 136 mmol/L (ref 135–145)

## 2020-10-17 LAB — CBC
HCT: 28.7 % — ABNORMAL LOW (ref 36.0–46.0)
Hemoglobin: 9 g/dL — ABNORMAL LOW (ref 12.0–15.0)
MCH: 23.4 pg — ABNORMAL LOW (ref 26.0–34.0)
MCHC: 31.4 g/dL (ref 30.0–36.0)
MCV: 74.5 fL — ABNORMAL LOW (ref 80.0–100.0)
Platelets: 422 10*3/uL — ABNORMAL HIGH (ref 150–400)
RBC: 3.85 MIL/uL — ABNORMAL LOW (ref 3.87–5.11)
RDW: 16.7 % — ABNORMAL HIGH (ref 11.5–15.5)
WBC: 10.5 10*3/uL (ref 4.0–10.5)
nRBC: 0 % (ref 0.0–0.2)

## 2020-10-17 LAB — TROPONIN I (HIGH SENSITIVITY): Troponin I (High Sensitivity): 12 ng/L (ref <18)

## 2020-10-17 LAB — BRAIN NATRIURETIC PEPTIDE: B Natriuretic Peptide: 401.4 pg/mL — ABNORMAL HIGH (ref 0.0–100.0)

## 2020-10-17 MED ORDER — FUROSEMIDE 20 MG PO TABS: 20.0000 mg | ORAL_TABLET | Freq: Every day | ORAL | 0 refills | Status: AC

## 2020-10-17 NOTE — ED Notes (Signed)
Son approaches this RN at nurses station stating " She is fine now, I want her discharged and we can take her home". Primary RN & MD notified.

## 2020-10-17 NOTE — Discharge Instructions (Addendum)
Please follow up with your primary care doctor. It is important that they continue to evaluate you for fluid overload. Additionally please talk to them about your chronic anemia, which was a little worse today. Please seek medical attention for any high fevers, chest pain, shortness of breath, change in behavior, persistent vomiting, bloody stool or any other new or concerning symptoms.

## 2020-10-17 NOTE — ED Triage Notes (Signed)
Pt via EMS from [facility] independent. Pt states that the staff called EMS for no reason, pt states that she is only having an anxiety attack. Denies the use of PRN anxiety medication. Pt endorsing heart palpitations. Denies SOB or any other symptoms, pt states she does not want to be here. Pt is A&Ox4 and NAD.   EMS states they had to place pt on 3L [place], pt's RA is 95% but tachypneic, placed on 2L [place] for comfort.

## 2020-10-17 NOTE — ED Provider Notes (Signed)
[facility] Emergency Department Provider Note  ____________________________________________   I have reviewed the triage vital signs and the nursing notes.   HISTORY  Chief Complaint No complaint   History limited by:  Poor historian   HPI Cindy Barnes is a 87 y.o. female who presents to the emergency department today via EMS from a living facility.  Patient is not sure why she was transported here.  The patient states that she was having a good day and next thing she knows she was being taken to the emergency department.  I did talk to staff from living as the over the telephone.  They state that the patient had called them to come evaluate her 2 times today because of concerns for some shortness of breath and possible anxiety.  They did find that the patient was having some increased work of breathing.  After the second time the decision was made to transport her to the hospital.  Records reviewed. Per medical record review patient has a history of atrial fibrillation.  Past Medical History:  Diagnosis Date   Arrhythmia    Atrial fibrillation (HCC)    Cervical dystonia    Chronic kidney disease    stage 3   Depression    Diabetes (HCC)    Diverticulitis    Hiatal hernia    Hypercholesteremia    Hypertension    Peripheral vascular disease (HCC)    Vitamin D deficiency     Patient Active Problem List   Diagnosis Date Noted   Cervical dystonia 04/16/2019    Past Surgical History:  Procedure Laterality Date   11th cranial nerve transection     ABDOMINAL HYSTERECTOMY     CHOLECYSTECTOMY      Prior to Admission medications   Medication Sig Start Date End Date Taking? Authorizing Provider  amiodarone (PACERONE) 200 MG tablet Take 200 mg by mouth as needed. M, W, F 12/04/17   [provider]  amLODipine (NORVASC) 10 MG tablet Take 1 tablet by mouth daily. 06/05/18   [provider]  apixaban (ELIQUIS) 5 MG TABS tablet Take 1  tablet by mouth daily. 08/11/17   [provider]  aspirin 81 MG chewable tablet Chew 1 tablet by mouth daily. 07/29/17   [provider]  B Complex Vitamins (B COMPLEX 1 PO) B Complex  1 QD    [provider]  BIOTIN PO Take 10,000 mcg by mouth daily.    [provider]  botulinum toxin Type A (BOTOX) 100 units SOLR injection Inject 100 Units into the muscle once.    [provider]  Botulinum Toxin Type A (BOTOX) 200 units SOLR Inject 200 Units as directed every 3 (three) months.    [provider]  Cholecalciferol (VITAMIN D3 PO) Take 1,000 Units by mouth daily.    [provider]  ezetimibe (ZETIA) 10 MG tablet Take 1 tablet by mouth daily. 07/20/09   [provider]  losartan (COZAAR) 25 MG tablet Take 1 tablet by mouth daily. 03/29/19   [provider]  Magnesium Oxide 400 MG CAPS Take 1 capsule by mouth daily.    [provider]  metFORMIN (GLUCOPHAGE) 500 MG tablet Take 500 mg by mouth daily. 07/02/18   [provider]  Omega-3 Fatty Acids (FISH OIL) 1000 MG CAPS Take 1 capsule by mouth daily.    [provider]  propranolol (INNOPRAN XL) 80 MG 24 hr capsule Take 1 capsule by mouth daily. 11/27/17  [provider]  rosuvastatin (CRESTOR) 5 MG tablet Take 1 tablet by mouth daily. 01/05/10   [provider]    Allergies Shellfish allergy, Doxycycline, Metronidazole, Olmesartan medoxomil-hctz, Penicillin g, Sulfa antibiotics, and Sulfamethoxazole-trimethoprim  Family History  Problem Relation Age of Onset   Heart attack Mother    COPD Father     Social History Social History   Tobacco Use   Smoking status: Never   Smokeless tobacco: Never  Substance Use Topics   Alcohol use: Yes    Comment: one glass of wine per week   Drug use: Never    Review of Systems Constitutional: No fever/chills Eyes: No visual changes. ENT: No sore throat. Cardiovascular:  Denies chest pain. Respiratory: Denies shortness of breath. Gastrointestinal: No abdominal pain.  No nausea, no vomiting.  No diarrhea.   Genitourinary: Negative for dysuria. Musculoskeletal: Negative for back pain. Skin: Negative for rash. Neurological: Negative for headaches, focal weakness or numbness.  ____________________________________________   PHYSICAL EXAM:  VITAL SIGNS: ED Triage Vitals  Enc Vitals Group     BP 10/17/20 1718 (!) 161/82     Pulse Rate 10/17/20 1718 76     Resp 10/17/20 1718 20     Temp 10/17/20 1718 98.3 F (36.8 C)     Temp Source 10/17/20 1718 Oral     SpO2 10/17/20 1718 96 %     Weight 10/17/20 1719 185 lb (83.9 kg)     Height 10/17/20 1719 5\' 6"  (1.676 m)     Head Circumference --      Peak Flow --      Pain Score 10/17/20 1719 0   Constitutional: Alert and oriented.  Eyes: Conjunctivae are normal.  ENT      Head: Normocephalic and atraumatic.      Nose: No congestion/rhinnorhea.      Mouth/Throat: Mucous membranes are moist.      Neck: No stridor. Hematological/Lymphatic/Immunilogical: No cervical lymphadenopathy. Cardiovascular: Normal rate, regular rhythm.  No murmurs, rubs, or gallops.  Respiratory: Normal respiratory effort without tachypnea nor retractions. Breath sounds are clear and equal bilaterally. No wheezes/rales/rhonchi. Gastrointestinal: Soft and non tender. No rebound. No guarding.  Genitourinary: Deferred Musculoskeletal: Normal range of motion in all extremities. Trace lower extremity edema.  Neurologic:  Normal speech and language. No gross focal neurologic deficits are appreciated.  Skin:  Skin is warm, dry and intact. No rash noted. Psychiatric: Mood and affect are normal. Speech and behavior are normal. Patient exhibits appropriate insight and judgment.  ____________________________________________    LABS (pertinent positives/negatives)  CBC wbc 10.5, hgb 9.0, plt 422 Trop hs 12 BNP 401.4 BMP wnl except glu  119 ____________________________________________   EKG  I, 12/17/20, attending physician, personally viewed and interpreted this EKG  EKG Time: 1720 Rate: 77 Rhythm: sinus rhythm with 1st degree av block Axis: left axis deviation Intervals: qtc 473 QRS: intraventricular conduction block ST changes: no st elevation Impression: abnormal ekg  ____________________________________________    RADIOLOGY  CXR Consistent with mild pulmonary edema.  ____________________________________________   PROCEDURES  Procedures  ____________________________________________   INITIAL IMPRESSION / ASSESSMENT AND PLAN / ED COURSE  Pertinent labs & imaging results that were available during my care of the patient were reviewed by me and considered in my medical decision making (see chart for details).   Patient presented to the emergency department today with no chief complaints.  However on talking to staff at living facility apparently she had complained of some shortness of breath  earlier in the day.  Patient's chest x-ray here is concerning for possible pulmonary edema.  Additionally BNP was slightly elevated.  I discussed this with patient and son at bedside.  Patient not requiring any supplemental oxygen here in the emergency department.  Will plan on discharging with brief course of Lasix.  Did asked that patient follow-up closely with her primary care. ____________________________________________   FINAL CLINICAL IMPRESSION(S) / ED DIAGNOSES  Final diagnoses:  Pulmonary edema, acute (HCC)     Note: This dictation was prepared with Dragon dictation. Any transcriptional errors that result from this process are unintentional     Phineas Semen, MD 10/17/20 [phone]

## 2020-10-17 NOTE — ED Triage Notes (Signed)
First nurse note: Patient arrived by EMS from home for heart racing. Patient reports she thinks her issue is an "anxiety attack" EMS vitals HR 82, 88% RA then with 3L 96%. EMS reports she refused blood pressure due to the cuff feeling "too tight"

## 2020-10-17 NOTE — ED Notes (Signed)
RN to bedside. Pt states "I do not understand why I am here. I didn't want to come to the hospital. I did not know I could tell EMS I didn't want to come. I came bc I thought I had to".   Pt in no acute distress.

## 2020-11-25 ENCOUNTER — Encounter: Payer: Self-pay | Admitting: Neurology

## 2020-11-25 ENCOUNTER — Other Ambulatory Visit: Payer: Self-pay | Admitting: Family Medicine

## 2020-11-25 ENCOUNTER — Ambulatory Visit (INDEPENDENT_AMBULATORY_CARE_PROVIDER_SITE_OTHER): Payer: Medicare Other | Admitting: Neurology

## 2020-11-25 VITALS — BP 138/60 | HR 69 | Ht 66.5 in | Wt 178.5 lb

## 2020-11-25 DIAGNOSIS — G243 Spasmodic torticollis: Secondary | ICD-10-CM | POA: Diagnosis not present

## 2020-11-25 DIAGNOSIS — Z1231 Encounter for screening mammogram for malignant neoplasm of breast: Secondary | ICD-10-CM

## 2020-11-25 NOTE — Progress Notes (Signed)
**  Botox 100 units x 2 vials, NDC 4709-6283-66, Lot [id number], Exp 11/2022, office supply.//mck,rn**

## 2020-11-25 NOTE — Progress Notes (Signed)
PATIENT: Cindy Barnes DOB: July 18, 1929  Chief Complaint  Patient presents with   Procedure    Room 16 - alone.     HISTORICAL  Cindy Barnes is a 87 year old female, seen in request by her primary care physician Dr. Baldemar Lenis, Beverely Low for evaluation of continued botulism toxin injection for her cervical dystonia, initial evaluation was on April 16, 2019.  I have reviewed and summarized the referring note from the referring physician.  She had past medical history of hypertension, hyperlipidemia, diabetes, atrial fibrillation, on chronic anticoagulation treatment.  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,  She moved from [place] [facility] in July 2020, she began to notice cervical dystonia symptoms in her 82s, at that time, she was under a lot of stress, develop had drawing to right shoulder, significant neck pain, eventually to the point is difficult for her to straighten out her neck, she had partial right accessory nerve resection by neurosurgeon in 1980s, which has made a big difference," likes which was turned off", since surgery, she denies significant low back pain  Over the years, her symptoms gradually returned, but to a much lesser degree, she described tendency of holding her head towards her right shoulder, frequent head titubation, also developed gradual onset bilateral hands tremor, she used to be right-handed, to the point she has difficulty writing with her right hand, she learned to write with her left hand, now even her left hand handwriting is ineligible, she also described gradual onset of voice change,  She began to receive EMG guided botulism toxin injection at [place] since 1990s, which has helped her symptoms, since movement to [place], she received a few injection by Dr. Alroy Dust, last one was on February 04, 2019, she received 300 units, tolerating it well, denies significant side effect.  She denies gait abnormality, no significant  pain,  UPDATE May 08 2019: This is her first EMG guided Botox a injection for her cervical dystonia through our office, she denies significant neck pain," when getting so used to it", she does have constant head titubation, we decided to decrease Botox a from 300 units to 200 units today,  UPDATE Feb 20 2020: Return for cervical dystonia injection, responding well to previous injection, but complains of significant medial pain, was injected by [facility] over the past few months, for convenience reasons, wants to transfer to our clinic,  UPDATE September 01 2020: She has been receiving injection at [facility] neurologist in between, last injection at [facility] was by Dr. Alroy Dust on May 21, 2020, received 300 units,  She returns today for the convenience, lives in [place], but she has been complaining of unbearable needle pain, seems to be more at our office injection compared to [facility] office  We used Botox a 200 units today, she will continue rest of the injections from [place],  [place] Nov 25 2020: She responded well to previous injection for neck pain, no significant side effect noted.  Is going to move to a different state soon, to be close to her son.  REVIEW OF SYSTEMS: Full 14 system review of systems performed and notable only for as above All other review of systems were negative.  ALLERGIES: Allergies  Allergen Reactions   Shellfish Allergy Nausea And Vomiting    Severe nausea/vomiting and diarrhea Severe nausea/vomiting and diarrhea    Doxycycline Diarrhea   Metronidazole Other (See Comments)   Olmesartan Medoxomil-Hctz     Other reaction(s): Unknown   Penicillin G Rash  Sulfa Antibiotics Rash   Sulfamethoxazole-Trimethoprim Rash    HOME MEDICATIONS: Current Outpatient Medications  Medication Sig Dispense Refill   amiodarone (PACERONE) 200 MG tablet Take 200 mg by mouth as needed. M, W, F     amLODipine (NORVASC) 10 MG tablet Take 1 tablet by mouth daily.     apixaban  (ELIQUIS) 5 MG TABS tablet Take 1 tablet by mouth daily.     aspirin 81 MG chewable tablet Chew 1 tablet by mouth daily.     B Complex Vitamins (B COMPLEX 1 PO) B Complex  1 QD     BIOTIN PO Take 10,000 mcg by mouth daily.     botulinum toxin Type A (BOTOX) 100 units SOLR injection Inject 100 Units into the muscle once.     Botulinum Toxin Type A (BOTOX) 200 units SOLR Inject 200 Units as directed every 3 (three) months.     Cholecalciferol (VITAMIN D3 PO) Take 1,000 Units by mouth daily.     ezetimibe (ZETIA) 10 MG tablet Take 1 tablet by mouth daily.     losartan (COZAAR) 25 MG tablet Take 1 tablet by mouth daily.     Magnesium Oxide 400 MG CAPS Take 1 capsule by mouth daily.     metFORMIN (GLUCOPHAGE) 500 MG tablet Take 500 mg by mouth daily.     Omega-3 Fatty Acids (FISH OIL) 1000 MG CAPS Take 1 capsule by mouth daily.     propranolol (INNOPRAN XL) 80 MG 24 hr capsule Take 1 capsule by mouth daily.     rosuvastatin (CRESTOR) 5 MG tablet Take 1 tablet by mouth daily.     furosemide (LASIX) 20 MG tablet Take 1 tablet (20 mg total) by mouth daily for 4 days. 4 tablet 0   No current facility-administered medications for this visit.    PAST MEDICAL HISTORY: Past Medical History:  Diagnosis Date   Arrhythmia    Atrial fibrillation (HCC)    Cervical dystonia    Chronic kidney disease    stage 3   Depression    Diabetes (HCC)    Diverticulitis    Hiatal hernia    Hypercholesteremia    Hypertension    Peripheral vascular disease (HCC)    Vitamin D deficiency     PAST SURGICAL HISTORY: Past Surgical History:  Procedure Laterality Date   11th cranial nerve transection     ABDOMINAL HYSTERECTOMY     CHOLECYSTECTOMY      FAMILY HISTORY: Family History  Problem Relation Age of Onset   Heart attack Mother    COPD Father     SOCIAL HISTORY: Social History   Socioeconomic History   Marital status: Widowed    Spouse name: Not on file   Number of children: 2   Years of  education: college   Highest education level: Bachelor's degree (e.g., BA, AB, BS)  Occupational History   Occupation: Retired  Tobacco Use   Smoking status: Never   Smokeless tobacco: Never  Substance and Sexual Activity   Alcohol use: Yes    Comment: one glass of wine per week   Drug use: Never   Sexual activity: Not on file  Other Topics Concern   Not on file  Social History Narrative   Lives alone.   One Coke per day.   Ambidextrous.   Social Determinants of Health   Financial Resource Strain: Not on file  Food Insecurity: Not on file  Transportation Needs: Not on file  Physical Activity: Not on  file  Stress: Not on file  Social Connections: Not on file  Intimate Partner Violence: Not on file     PHYSICAL EXAM   Vitals:   11/25/20 1329  BP: 138/60  Pulse: 69  Weight: 178 lb 8 oz (81 kg)  Height: 5' 6.5" (1.689 m)   Not recorded     Body mass index is 28.38 kg/m.  PHYSICAL EXAMNIATION:  She has moderate right turn, mild anterocollis, left tilt, left shoulder elevation, constant head titubation  ASSESSMENT AND PLAN  Cindy Barnes is a 87 y.o. 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 Ph.D.  [facility] [address], [place], [place] [address] Ph: [phone] Fax: [phone]  CC: Derinda Late, MD

## 2020-11-29 DIAGNOSIS — G243 Spasmodic torticollis: Secondary | ICD-10-CM

## 2020-11-29 MED ORDER — ONABOTULINUMTOXINA 100 UNITS IJ SOLR
200.0000 [IU] | Freq: Once | INTRAMUSCULAR | Status: AC
Start: 2020-11-29 — End: 2020-11-29
  Administered 2020-11-29: 200 [IU] via INTRAMUSCULAR

## 2020-12-02 ENCOUNTER — Ambulatory Visit: Payer: Medicare Other | Admitting: Neurology

## 2021-01-18 ENCOUNTER — Telehealth: Payer: Self-pay | Admitting: Neurology

## 2021-01-18 DIAGNOSIS — G243 Spasmodic torticollis: Secondary | ICD-10-CM

## 2021-01-18 NOTE — Telephone Encounter (Signed)
Referral has been placed. 

## 2021-01-18 NOTE — Telephone Encounter (Signed)
Pt has moved to [place], need a referral sent to:   Dr. Deno Etienne Phone:[phone] Fax: [phone]

## 2021-02-24 ENCOUNTER — Ambulatory Visit: Payer: Medicare Other | Admitting: Neurology

## 2022-11-10 IMAGING — MG DIGITAL SCREENING BILAT W/ TOMO W/ CAD
8 series · 8 of 24 positions shown · non-contrast
Comparison: Previous exam(s).

CLINICAL DATA: Screening.

EXAM:
DIGITAL SCREENING BILATERAL MAMMOGRAM WITH TOMO AND CAD

[L CC synth-2D]
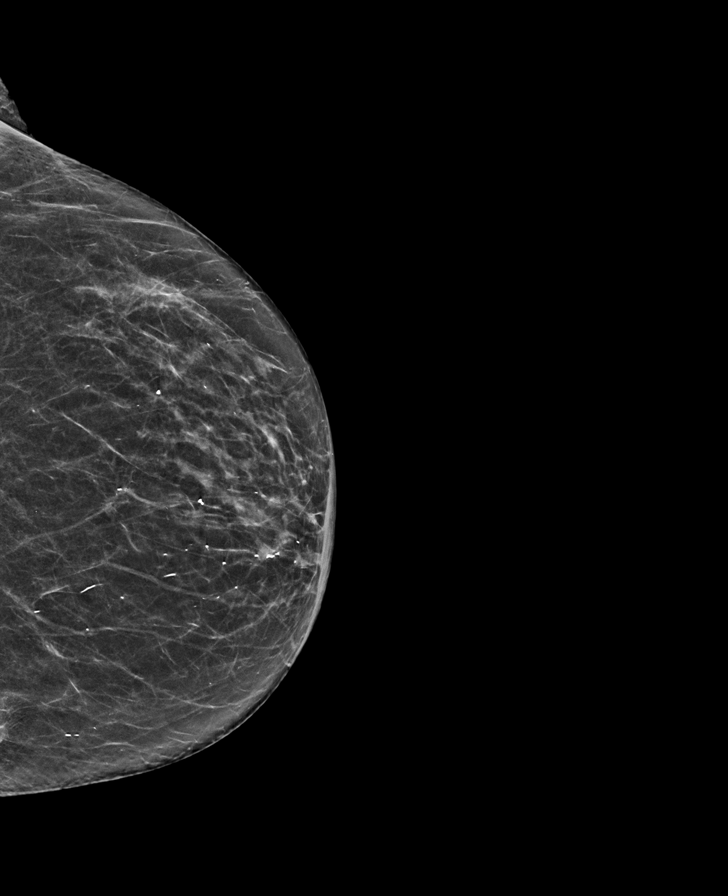

[L MLO synth-2D]
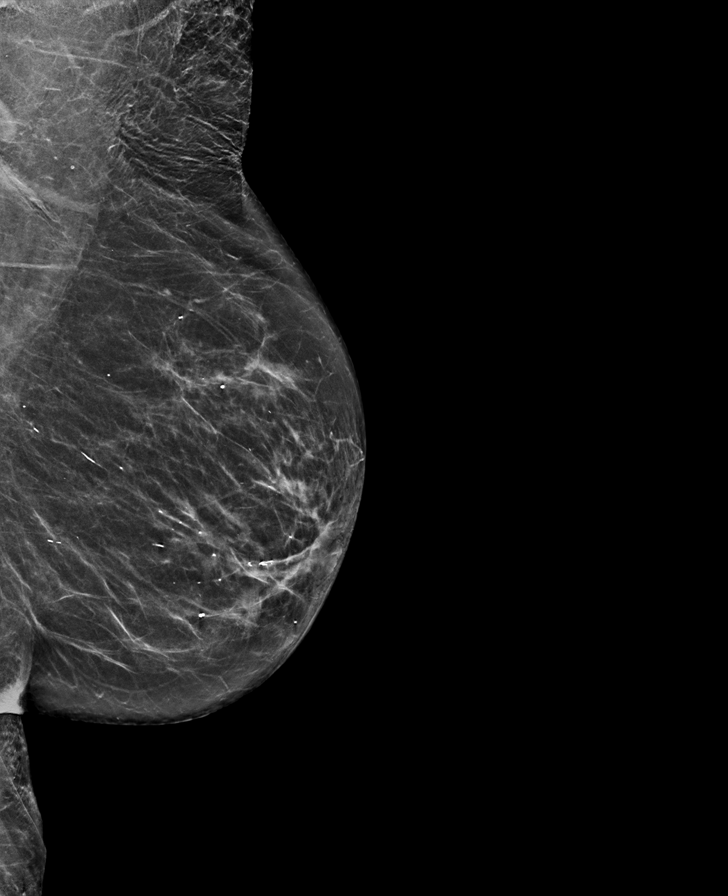

[R MLO synth-2D]
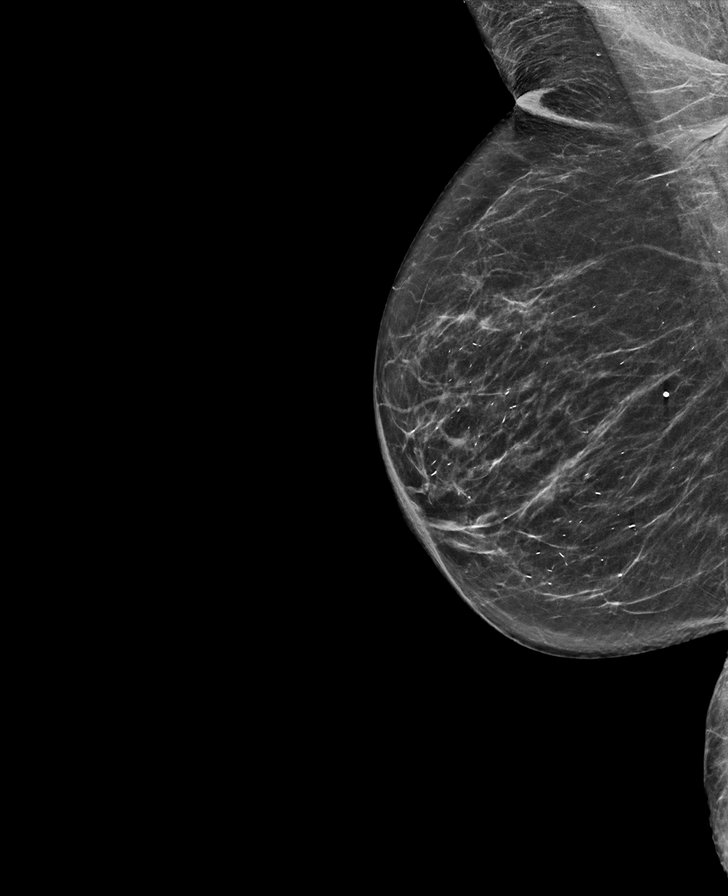

[R CC synth-2D]
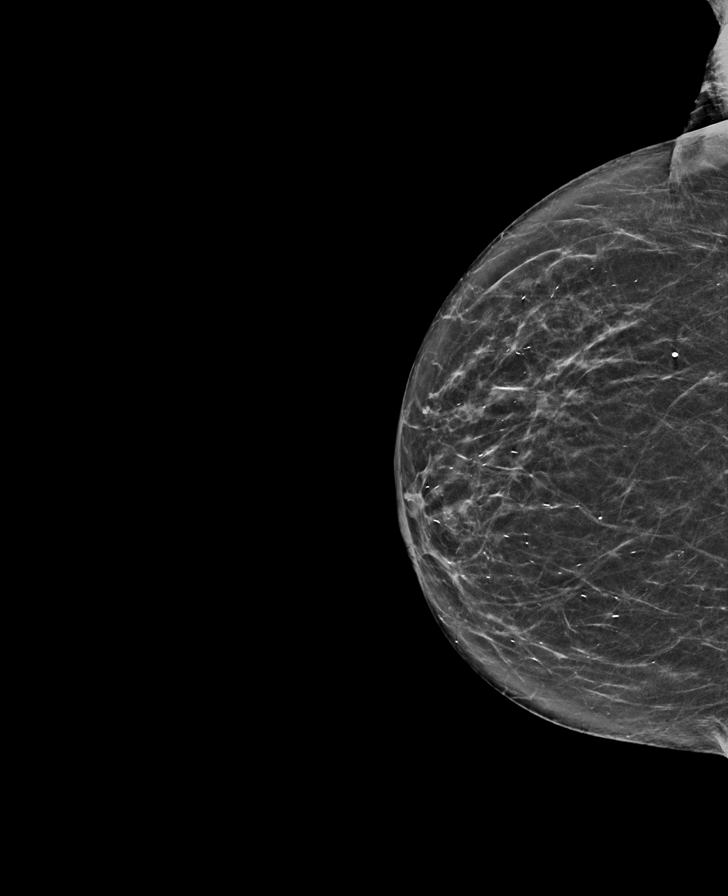

[R MLO tomo · tomo slice 32/63.0]
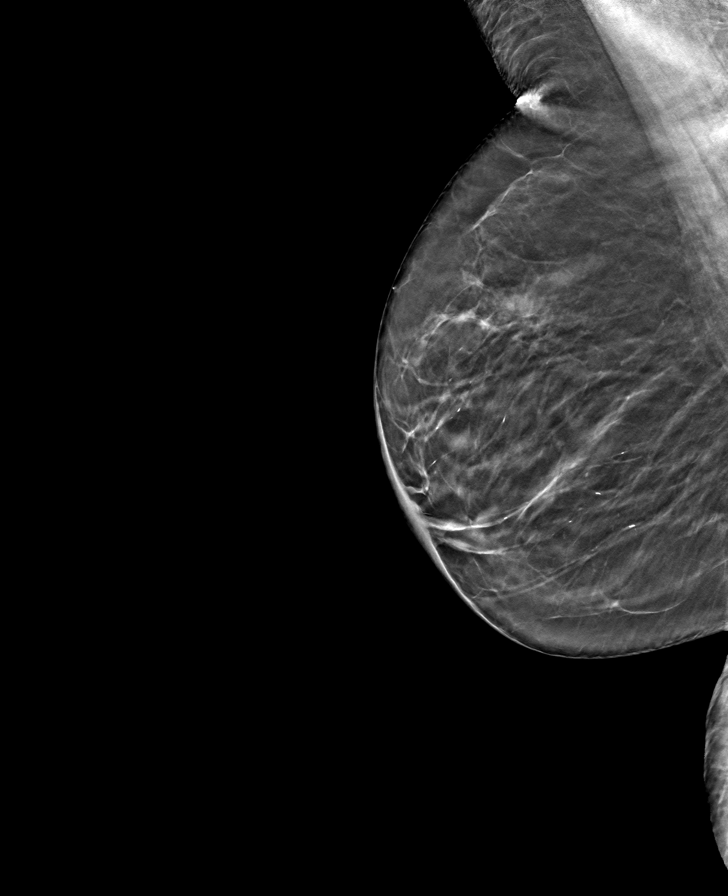

[L MLO tomo · tomo slice 34/67.0]
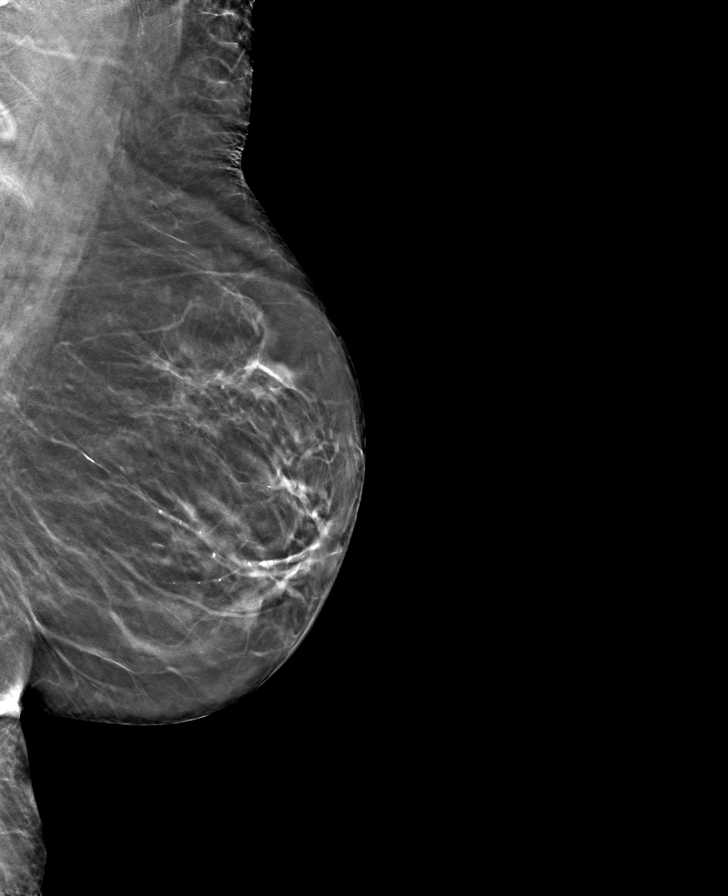

[L CC tomo · tomo slice 31/60.0]
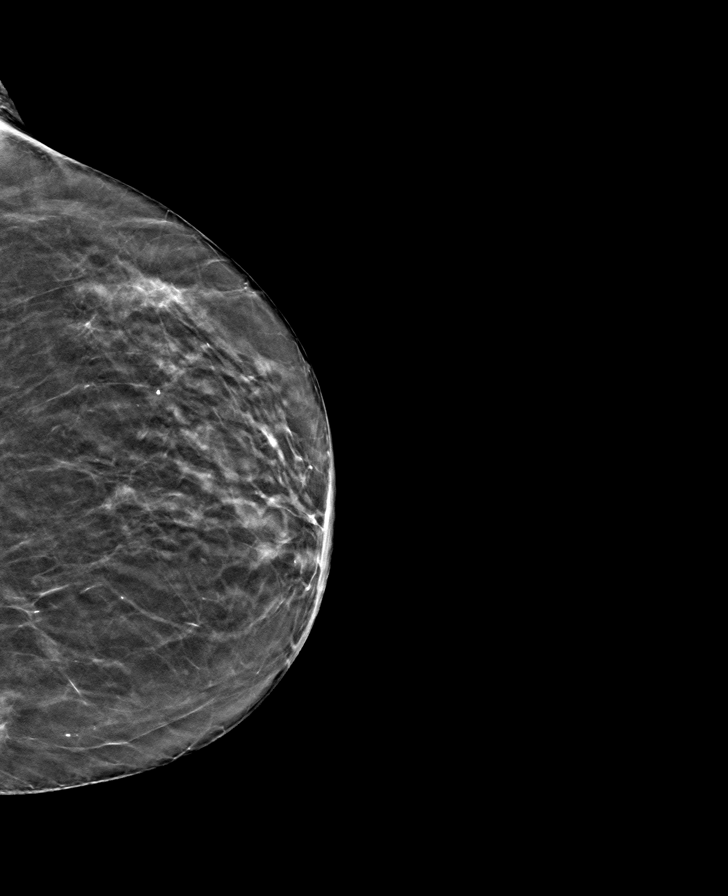

[R CC tomo · tomo slice 31/61.0]
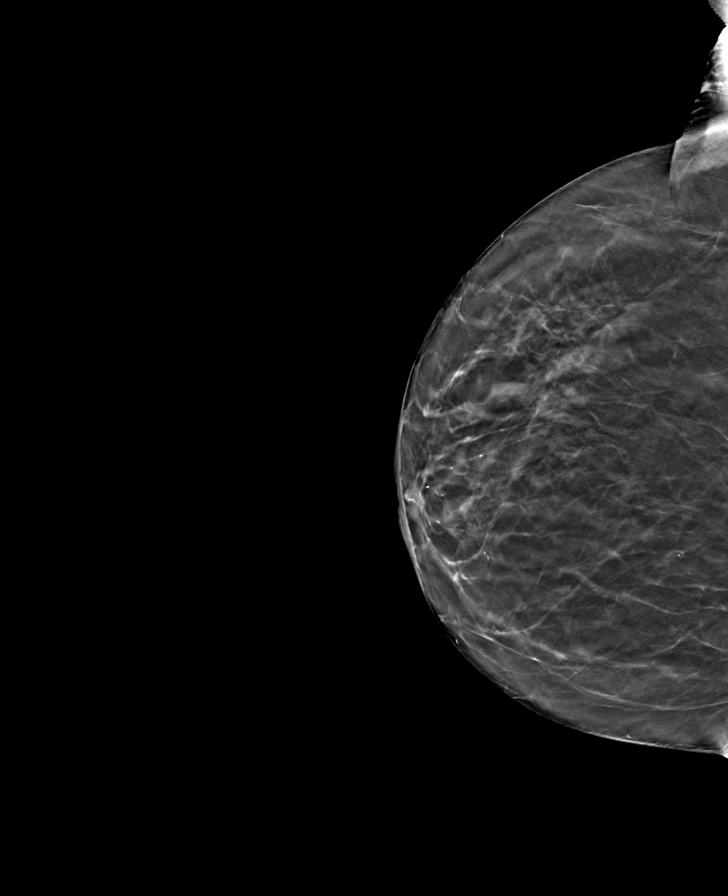

[8 of 24 positions shown; findings below may reference images not displayed]

ACR Breast Density Category b: There are scattered areas of
fibroglandular density.
FINDINGS: There are no findings suspicious for malignancy. Images were
processed with CAD.
IMPRESSION: No mammographic evidence of malignancy. A result letter of this
screening mammogram will be mailed directly to the patient.

RECOMMENDATION:
Screening mammogram in one year. (Code:[id number])

BI-RADS CATEGORY  1: Negative.

## 2023-08-27 IMAGING — CR DG CHEST 2V
1 series · 2 of 2 positions shown · non-contrast
Comparison: None.

CLINICAL DATA: Palpitations, anxiety attack

EXAM:
CHEST - 2 VIEW

[Series 1: dg chest 2 view · 0.14mm/px · 2 of 2 slices shown]
[im 1/2]
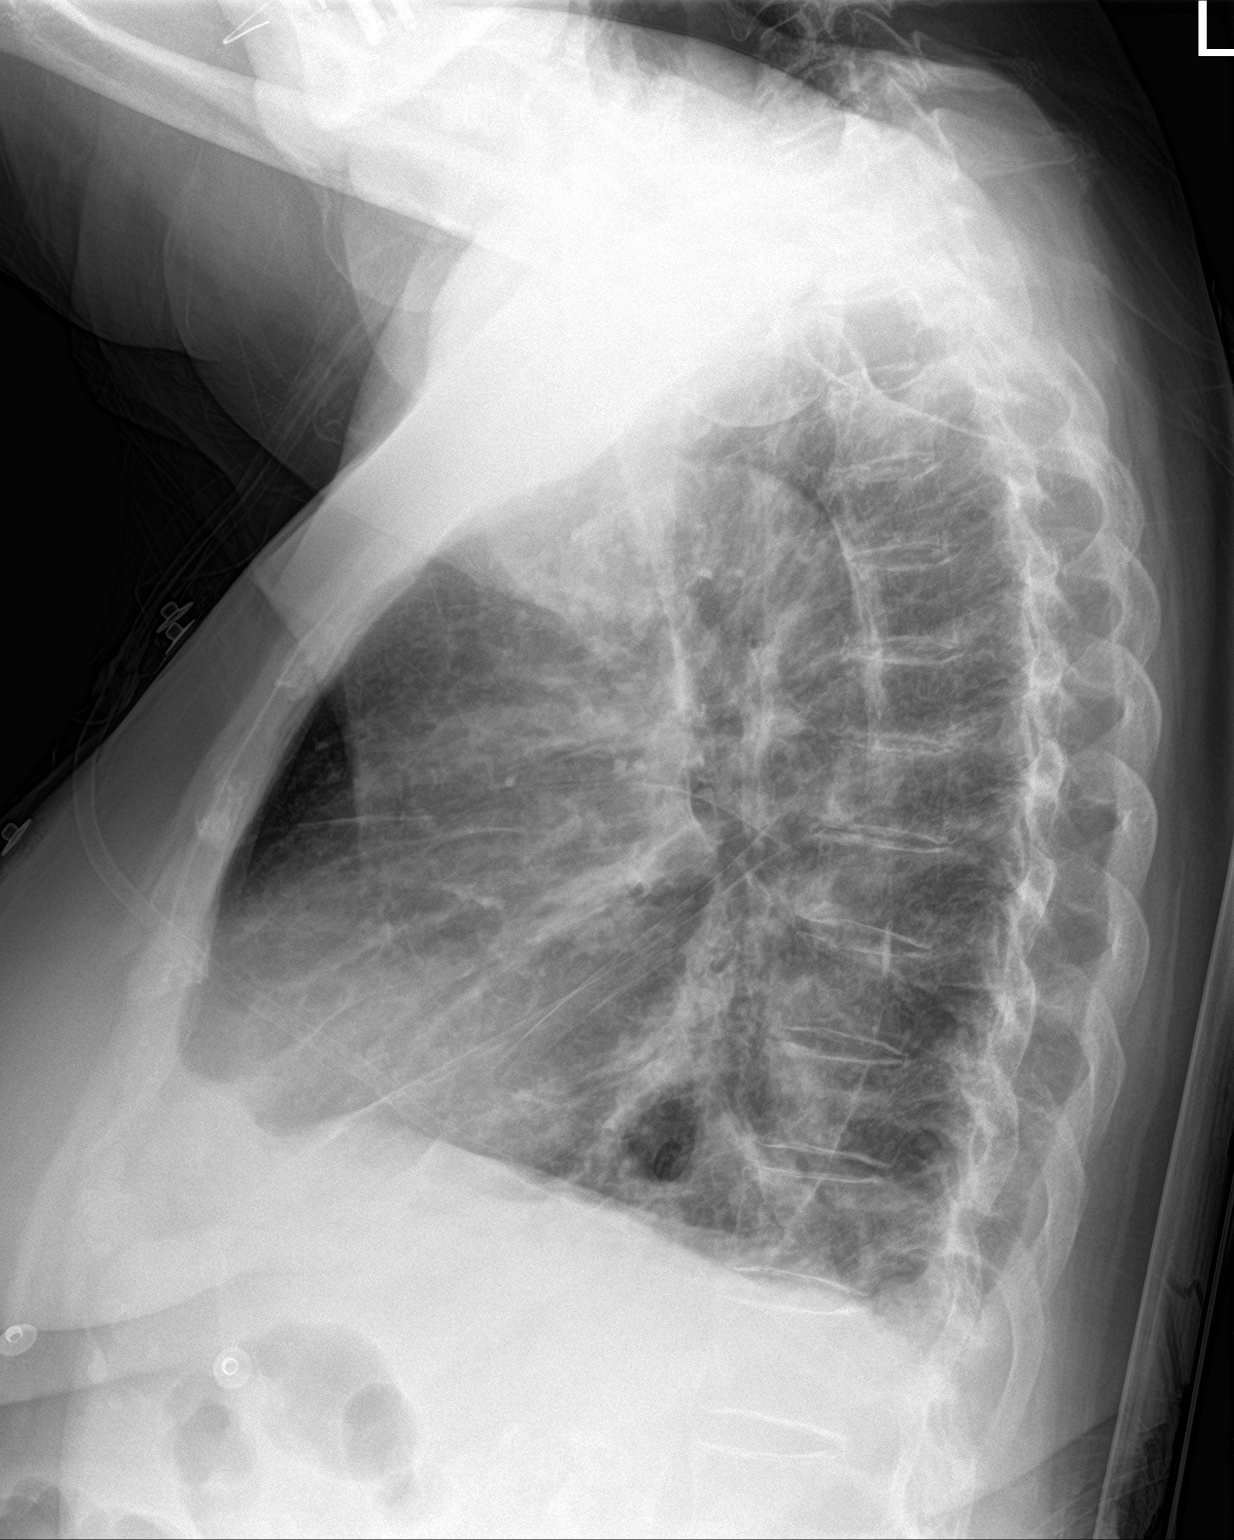
[im 2/2]
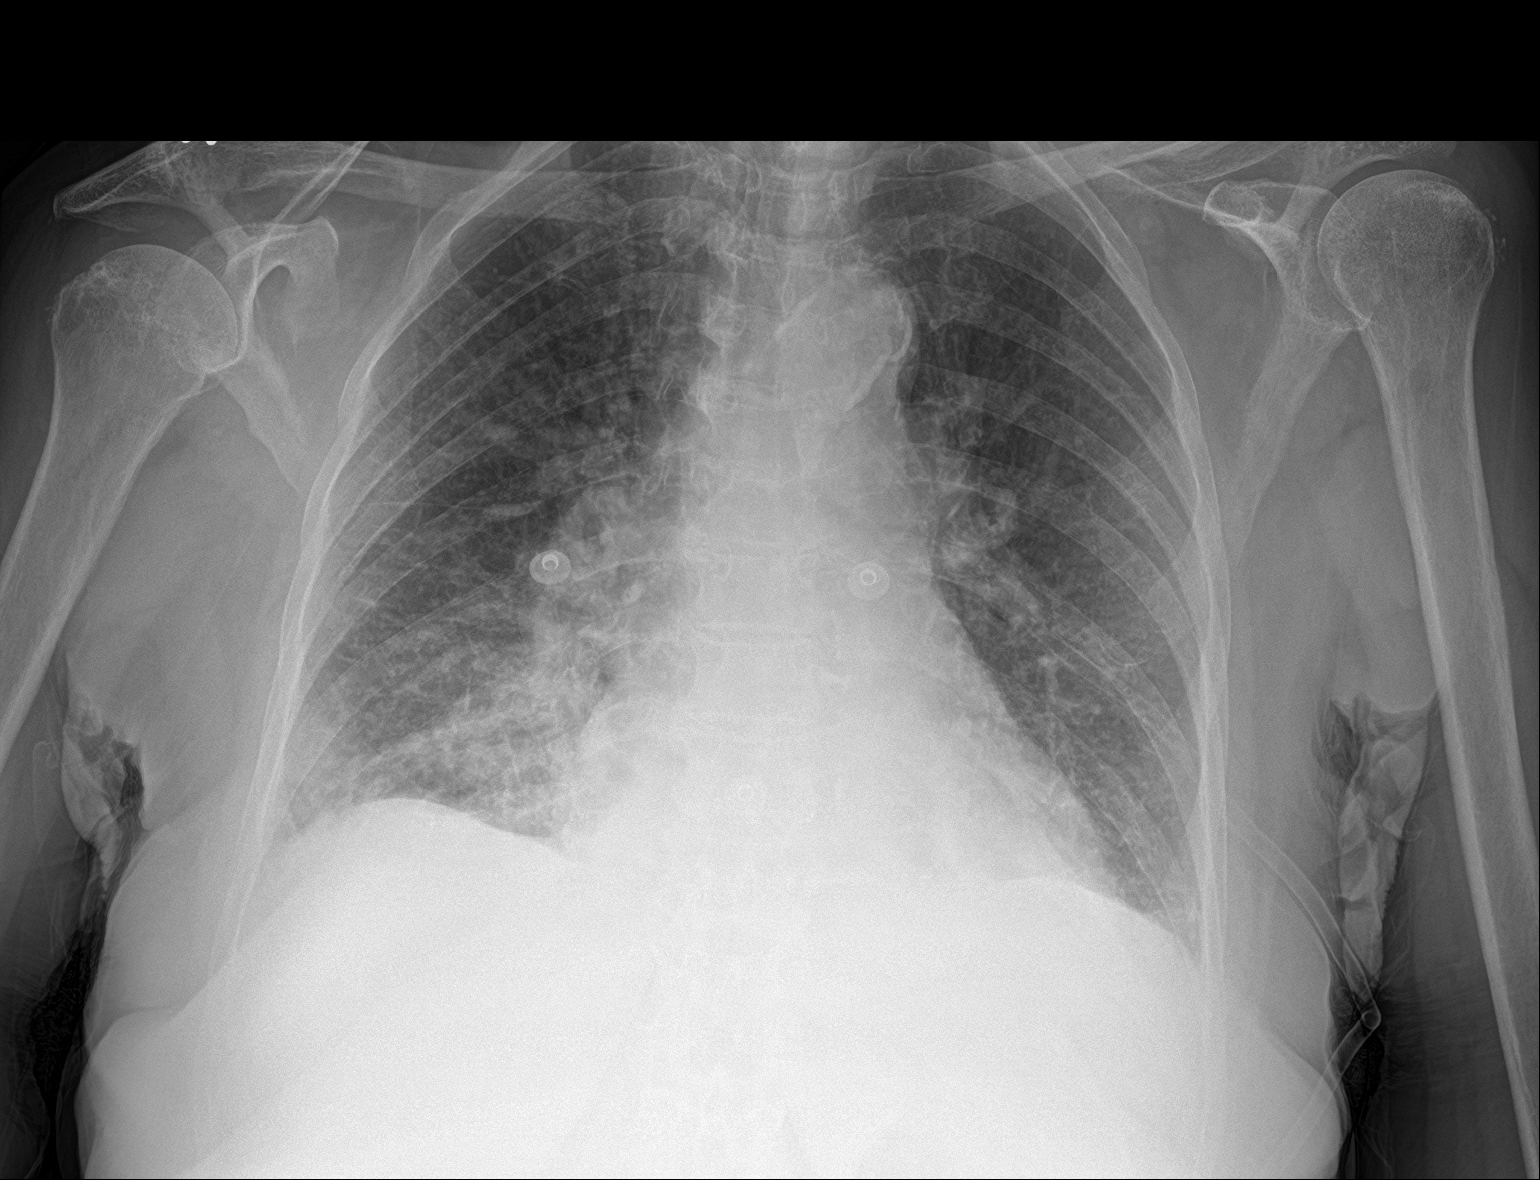

[2 of 2 positions shown; findings below may reference images not displayed]

FINDINGS: Frontal and lateral views of the chest demonstrate mild enlargement
the cardiac silhouette. Prominent atherosclerosis of the aortic
arch. There is central vascular congestion, with bibasilar
interstitial prominence which could reflect scarring or developing
edema. No effusion or pneumothorax.
IMPRESSION: 1. Findings consistent with mild interstitial edema.
